# Patient Record
Sex: Female | Born: 1960 | Race: Asian | Hispanic: No | Marital: Married | Smoking: Never smoker
Health system: Southern US, Community
[De-identification: ages and names within clinical notes are randomized; demographics above are authoritative.]

## PROBLEM LIST (undated history)

## (undated) DIAGNOSIS — E079 Disorder of thyroid, unspecified: Secondary | ICD-10-CM

## (undated) DIAGNOSIS — I10 Essential (primary) hypertension: Secondary | ICD-10-CM

---

## 2004-06-20 ENCOUNTER — Ambulatory Visit (HOSPITAL_COMMUNITY): Admission: RE | Admit: 2004-06-20 | Discharge: 2004-06-20 | Payer: Self-pay | Admitting: Internal Medicine

## 2006-05-08 ENCOUNTER — Ambulatory Visit: Payer: Self-pay | Admitting: Nurse Practitioner

## 2006-05-18 ENCOUNTER — Ambulatory Visit (HOSPITAL_COMMUNITY): Admission: RE | Admit: 2006-05-18 | Discharge: 2006-05-18 | Payer: Self-pay | Admitting: Nurse Practitioner

## 2006-10-22 ENCOUNTER — Ambulatory Visit: Payer: Self-pay | Admitting: Internal Medicine

## 2006-10-24 ENCOUNTER — Ambulatory Visit: Payer: Self-pay | Admitting: *Deleted

## 2006-12-03 ENCOUNTER — Ambulatory Visit: Payer: Self-pay | Admitting: Internal Medicine

## 2006-12-05 ENCOUNTER — Ambulatory Visit (HOSPITAL_COMMUNITY): Admission: RE | Admit: 2006-12-05 | Discharge: 2006-12-05 | Payer: Self-pay | Admitting: Nurse Practitioner

## 2007-01-09 ENCOUNTER — Ambulatory Visit: Payer: Self-pay | Admitting: Nurse Practitioner

## 2007-04-03 ENCOUNTER — Ambulatory Visit: Payer: Self-pay | Admitting: Family Medicine

## 2007-06-05 ENCOUNTER — Encounter (INDEPENDENT_AMBULATORY_CARE_PROVIDER_SITE_OTHER): Payer: Self-pay | Admitting: *Deleted

## 2007-06-17 DIAGNOSIS — R51 Headache: Secondary | ICD-10-CM | POA: Insufficient documentation

## 2007-06-17 DIAGNOSIS — R519 Headache, unspecified: Secondary | ICD-10-CM | POA: Insufficient documentation

## 2007-06-26 ENCOUNTER — Encounter (INDEPENDENT_AMBULATORY_CARE_PROVIDER_SITE_OTHER): Payer: Self-pay | Admitting: Family Medicine

## 2007-06-26 ENCOUNTER — Ambulatory Visit: Payer: Self-pay | Admitting: Internal Medicine

## 2007-06-26 LAB — CONVERTED CEMR LAB
BUN: 9 mg/dL (ref 6–23)
CO2: 22 meq/L (ref 19–32)
Calcium: 8.8 mg/dL (ref 8.4–10.5)
Chloride: 109 meq/L (ref 96–112)
Creatinine, Ser: 0.66 mg/dL (ref 0.40–1.20)
Glucose, Bld: 75 mg/dL (ref 70–99)
Potassium: 4.9 meq/L (ref 3.5–5.3)
Sodium: 143 meq/L (ref 135–145)

## 2007-07-11 ENCOUNTER — Ambulatory Visit: Payer: Self-pay | Admitting: Internal Medicine

## 2007-07-11 ENCOUNTER — Encounter (INDEPENDENT_AMBULATORY_CARE_PROVIDER_SITE_OTHER): Payer: Self-pay | Admitting: Nurse Practitioner

## 2007-07-11 LAB — CONVERTED CEMR LAB
Cholesterol: 166 mg/dL (ref 0–200)
HDL: 54 mg/dL (ref >39)
LDL Cholesterol: 85 mg/dL (ref 0–99)
Total CHOL/HDL Ratio: 3.1
Triglycerides: 136 mg/dL (ref <150)
VLDL: 27 mg/dL (ref 0–40)

## 2007-11-08 ENCOUNTER — Ambulatory Visit: Payer: Self-pay | Admitting: Family Medicine

## 2007-12-25 ENCOUNTER — Encounter (INDEPENDENT_AMBULATORY_CARE_PROVIDER_SITE_OTHER): Payer: Self-pay | Admitting: Nurse Practitioner

## 2007-12-25 ENCOUNTER — Ambulatory Visit: Payer: Self-pay | Admitting: Internal Medicine

## 2007-12-25 LAB — CONVERTED CEMR LAB
ALT: 27 units/L (ref 0–35)
AST: 24 units/L (ref 0–37)
Albumin: 4.2 g/dL (ref 3.5–5.2)
Alkaline Phosphatase: 61 units/L (ref 39–117)
BUN: 13 mg/dL (ref 6–23)
Basophils Absolute: 0 10*3/uL (ref 0.0–0.1)
Basophils Relative: 0 % (ref 0–1)
CO2: 23 meq/L (ref 19–32)
Calcium: 8.9 mg/dL (ref 8.4–10.5)
Chloride: 103 meq/L (ref 96–112)
Cholesterol: 186 mg/dL (ref 0–200)
Creatinine, Ser: 0.76 mg/dL (ref 0.40–1.20)
Eosinophils Absolute: 0.1 10*3/uL (ref 0.0–0.7)
Eosinophils Relative: 1 % (ref 0–5)
Glucose, Bld: 78 mg/dL (ref 70–99)
HCT: 41.2 % (ref 36.0–46.0)
HDL: 48 mg/dL (ref >39)
Hemoglobin: 13.1 g/dL (ref 12.0–15.0)
LDL Cholesterol: 98 mg/dL (ref 0–99)
Lymphocytes Relative: 39 % (ref 12–46)
Lymphs Abs: 4.2 10*3/uL — ABNORMAL HIGH (ref 0.7–4.0)
MCHC: 31.8 g/dL (ref 30.0–36.0)
MCV: 92.2 fL (ref 78.0–100.0)
Monocytes Absolute: 0.7 10*3/uL (ref 0.1–1.0)
Monocytes Relative: 6 % (ref 3–12)
Neutro Abs: 5.8 10*3/uL (ref 1.7–7.7)
Neutrophils Relative %: 54 % (ref 43–77)
Platelets: 333 10*3/uL (ref 150–400)
Potassium: 4.4 meq/L (ref 3.5–5.3)
RBC: 4.47 M/uL (ref 3.87–5.11)
RDW: 13.3 % (ref 11.5–15.5)
Sodium: 138 meq/L (ref 135–145)
TSH: 3.366 microintl units/mL (ref 0.350–5.50)
Total Bilirubin: 0.5 mg/dL (ref 0.3–1.2)
Total CHOL/HDL Ratio: 3.9
Total Protein: 7.8 g/dL (ref 6.0–8.3)
Triglycerides: 199 mg/dL — ABNORMAL HIGH (ref <150)
VLDL: 40 mg/dL (ref 0–40)
WBC: 10.9 10*3/uL — ABNORMAL HIGH (ref 4.0–10.5)

## 2008-01-01 ENCOUNTER — Ambulatory Visit (HOSPITAL_COMMUNITY): Admission: RE | Admit: 2008-01-01 | Discharge: 2008-01-01 | Payer: Self-pay | Admitting: Family Medicine

## 2008-01-01 ENCOUNTER — Ambulatory Visit: Payer: Self-pay | Admitting: Cardiology

## 2008-01-01 ENCOUNTER — Encounter (INDEPENDENT_AMBULATORY_CARE_PROVIDER_SITE_OTHER): Payer: Self-pay | Admitting: Family Medicine

## 2008-01-02 ENCOUNTER — Ambulatory Visit (HOSPITAL_COMMUNITY): Admission: RE | Admit: 2008-01-02 | Discharge: 2008-01-02 | Payer: Self-pay | Admitting: Nurse Practitioner

## 2008-01-21 ENCOUNTER — Encounter (INDEPENDENT_AMBULATORY_CARE_PROVIDER_SITE_OTHER): Payer: Self-pay | Admitting: Nurse Practitioner

## 2008-01-21 ENCOUNTER — Ambulatory Visit: Payer: Self-pay | Admitting: Internal Medicine

## 2008-01-21 LAB — CONVERTED CEMR LAB
Cholesterol: 159 mg/dL (ref 0–200)
HDL: 55 mg/dL (ref >39)
LDL Cholesterol: 78 mg/dL (ref 0–99)
Total CHOL/HDL Ratio: 2.9
Triglycerides: 130 mg/dL (ref <150)
VLDL: 26 mg/dL (ref 0–40)

## 2008-01-27 ENCOUNTER — Ambulatory Visit: Payer: Self-pay | Admitting: Internal Medicine

## 2010-07-20 ENCOUNTER — Ambulatory Visit (HOSPITAL_COMMUNITY): Admission: RE | Admit: 2010-07-20 | Discharge: 2010-07-20 | Payer: Self-pay | Admitting: Obstetrics & Gynecology

## 2010-08-25 ENCOUNTER — Observation Stay (HOSPITAL_COMMUNITY)
Admission: EM | Admit: 2010-08-25 | Discharge: 2010-08-26 | Payer: Self-pay | Source: Home / Self Care | Attending: Emergency Medicine | Admitting: Emergency Medicine

## 2010-08-25 ENCOUNTER — Emergency Department (HOSPITAL_COMMUNITY)
Admission: EM | Admit: 2010-08-25 | Discharge: 2010-08-25 | Disposition: A | Discharge status: Other Acute Inpatient Hospital | Payer: Self-pay | Source: Home / Self Care | Admitting: Family Medicine

## 2010-08-26 ENCOUNTER — Encounter (INDEPENDENT_AMBULATORY_CARE_PROVIDER_SITE_OTHER): Payer: Self-pay | Admitting: Emergency Medicine

## 2010-10-09 ENCOUNTER — Encounter: Payer: Self-pay | Admitting: Interventional Cardiology

## 2010-10-27 ENCOUNTER — Ambulatory Visit (HOSPITAL_COMMUNITY)
Admission: RE | Admit: 2010-10-27 | Discharge: 2010-10-27 | Disposition: A | Discharge status: Home / Self Care | Payer: Self-pay | Source: Ambulatory Visit | Attending: Family Medicine | Admitting: Family Medicine

## 2010-10-27 DIAGNOSIS — R0602 Shortness of breath: Secondary | ICD-10-CM | POA: Insufficient documentation

## 2010-11-29 LAB — POCT I-STAT, CHEM 8
BUN: 10 mg/dL (ref 6–23)
Calcium, Ion: 1.09 mmol/L — ABNORMAL LOW (ref 1.12–1.32)
Chloride: 107 mEq/L (ref 96–112)
Creatinine, Ser: 0.9 mg/dL (ref 0.4–1.2)
Glucose, Bld: 97 mg/dL (ref 70–99)
HCT: 42 % (ref 36.0–46.0)
Hemoglobin: 14.3 g/dL (ref 12.0–15.0)
Potassium: 3.7 mEq/L (ref 3.5–5.1)
Sodium: 139 mEq/L (ref 135–145)
TCO2: 25 mmol/L (ref 0–100)

## 2010-11-29 LAB — POCT CARDIAC MARKERS
CKMB, poc: 1.5 ng/mL (ref 1.0–8.0)
CKMB, poc: <1 ng/mL — ABNORMAL LOW (ref 1.0–8.0)
Myoglobin, poc: 46 ng/mL (ref 12–200)
Myoglobin, poc: 63.5 ng/mL (ref 12–200)
Troponin i, poc: <0.05 ng/mL (ref 0.00–0.09)
Troponin i, poc: <0.05 ng/mL (ref 0.00–0.09)

## 2010-11-29 LAB — CK TOTAL AND CKMB (NOT AT ARMC)
CK, MB: 1.1 ng/mL (ref 0.3–4.0)
Relative Index: INVALID (ref 0.0–2.5)
Total CK: 85 U/L (ref 7–177)

## 2010-11-29 LAB — TROPONIN I: Troponin I: 0.03 ng/mL (ref 0.00–0.06)

## 2011-05-16 ENCOUNTER — Other Ambulatory Visit (HOSPITAL_COMMUNITY): Payer: Self-pay | Admitting: Family Medicine

## 2011-05-16 ENCOUNTER — Ambulatory Visit (HOSPITAL_COMMUNITY)
Admission: RE | Admit: 2011-05-16 | Discharge: 2011-05-16 | Disposition: A | Discharge status: Home / Self Care | Payer: Self-pay | Source: Ambulatory Visit | Attending: Family Medicine | Admitting: Family Medicine

## 2011-05-16 DIAGNOSIS — R52 Pain, unspecified: Secondary | ICD-10-CM

## 2011-05-16 DIAGNOSIS — M8448XA Pathological fracture, other site, initial encounter for fracture: Secondary | ICD-10-CM | POA: Insufficient documentation

## 2011-05-16 DIAGNOSIS — R609 Edema, unspecified: Secondary | ICD-10-CM | POA: Insufficient documentation

## 2011-05-16 DIAGNOSIS — M79609 Pain in unspecified limb: Secondary | ICD-10-CM | POA: Insufficient documentation

## 2011-06-19 ENCOUNTER — Other Ambulatory Visit: Payer: Self-pay | Admitting: Family Medicine

## 2011-08-07 ENCOUNTER — Other Ambulatory Visit (HOSPITAL_COMMUNITY): Payer: Self-pay | Admitting: Family Medicine

## 2011-08-07 DIAGNOSIS — Z1231 Encounter for screening mammogram for malignant neoplasm of breast: Secondary | ICD-10-CM

## 2011-09-08 ENCOUNTER — Ambulatory Visit (HOSPITAL_COMMUNITY): Payer: Self-pay

## 2011-12-05 ENCOUNTER — Ambulatory Visit: Payer: Self-pay | Attending: Orthopedic Surgery | Admitting: Occupational Therapy

## 2011-12-05 DIAGNOSIS — M25549 Pain in joints of unspecified hand: Secondary | ICD-10-CM | POA: Insufficient documentation

## 2011-12-05 DIAGNOSIS — M25649 Stiffness of unspecified hand, not elsewhere classified: Secondary | ICD-10-CM | POA: Insufficient documentation

## 2011-12-05 DIAGNOSIS — IMO0001 Reserved for inherently not codable concepts without codable children: Secondary | ICD-10-CM | POA: Insufficient documentation

## 2011-12-13 ENCOUNTER — Ambulatory Visit: Payer: Self-pay | Admitting: Occupational Therapy

## 2011-12-19 ENCOUNTER — Ambulatory Visit: Payer: Self-pay | Attending: Orthopedic Surgery | Admitting: Occupational Therapy

## 2011-12-19 DIAGNOSIS — M25549 Pain in joints of unspecified hand: Secondary | ICD-10-CM | POA: Insufficient documentation

## 2011-12-19 DIAGNOSIS — M25649 Stiffness of unspecified hand, not elsewhere classified: Secondary | ICD-10-CM | POA: Insufficient documentation

## 2011-12-19 DIAGNOSIS — IMO0001 Reserved for inherently not codable concepts without codable children: Secondary | ICD-10-CM | POA: Insufficient documentation

## 2011-12-20 ENCOUNTER — Ambulatory Visit: Payer: Self-pay | Admitting: Occupational Therapy

## 2011-12-25 ENCOUNTER — Ambulatory Visit: Payer: Self-pay | Admitting: Occupational Therapy

## 2011-12-27 ENCOUNTER — Encounter: Payer: Self-pay | Admitting: Occupational Therapy

## 2011-12-28 ENCOUNTER — Ambulatory Visit: Payer: Self-pay | Admitting: Occupational Therapy

## 2012-01-01 ENCOUNTER — Ambulatory Visit: Payer: Self-pay | Admitting: Occupational Therapy

## 2012-01-03 ENCOUNTER — Ambulatory Visit: Payer: Self-pay | Admitting: Occupational Therapy

## 2012-01-31 ENCOUNTER — Ambulatory Visit (HOSPITAL_COMMUNITY)
Admission: RE | Admit: 2012-01-31 | Discharge: 2012-01-31 | Disposition: A | Discharge status: Home / Self Care | Payer: Self-pay | Source: Ambulatory Visit | Attending: Family Medicine | Admitting: Family Medicine

## 2012-01-31 ENCOUNTER — Other Ambulatory Visit (HOSPITAL_COMMUNITY): Payer: Self-pay | Admitting: Family Medicine

## 2012-01-31 DIAGNOSIS — M7989 Other specified soft tissue disorders: Secondary | ICD-10-CM | POA: Insufficient documentation

## 2012-01-31 DIAGNOSIS — M79609 Pain in unspecified limb: Secondary | ICD-10-CM | POA: Insufficient documentation

## 2012-01-31 DIAGNOSIS — M899 Disorder of bone, unspecified: Secondary | ICD-10-CM | POA: Insufficient documentation

## 2012-01-31 DIAGNOSIS — R52 Pain, unspecified: Secondary | ICD-10-CM

## 2012-01-31 DIAGNOSIS — M949 Disorder of cartilage, unspecified: Secondary | ICD-10-CM | POA: Insufficient documentation

## 2012-02-23 ENCOUNTER — Ambulatory Visit (HOSPITAL_COMMUNITY)
Admission: RE | Admit: 2012-02-23 | Discharge: 2012-02-23 | Disposition: A | Discharge status: Home / Self Care | Payer: Self-pay | Source: Ambulatory Visit | Attending: Family Medicine | Admitting: Family Medicine

## 2012-02-23 DIAGNOSIS — Z1231 Encounter for screening mammogram for malignant neoplasm of breast: Secondary | ICD-10-CM | POA: Insufficient documentation

## 2012-10-17 ENCOUNTER — Emergency Department (HOSPITAL_COMMUNITY)
Admission: EM | Admit: 2012-10-17 | Discharge: 2012-10-17 | Disposition: A | Discharge status: Home / Self Care | Payer: Self-pay | Source: Home / Self Care | Attending: Family Medicine | Admitting: Family Medicine

## 2012-10-17 DIAGNOSIS — Z23 Encounter for immunization: Secondary | ICD-10-CM

## 2012-10-17 DIAGNOSIS — I10 Essential (primary) hypertension: Secondary | ICD-10-CM

## 2012-10-17 DIAGNOSIS — R002 Palpitations: Secondary | ICD-10-CM

## 2012-10-17 LAB — CBC
HCT: 42.9 % (ref 36.0–46.0)
Hemoglobin: 15.3 g/dL — ABNORMAL HIGH (ref 12.0–15.0)
MCH: 30.5 pg (ref 26.0–34.0)
MCHC: 35.7 g/dL (ref 30.0–36.0)
MCV: 85.6 fL (ref 78.0–100.0)
Platelets: 261 10*3/uL (ref 150–400)
RBC: 5.01 MIL/uL (ref 3.87–5.11)
RDW: 13 % (ref 11.5–15.5)
WBC: 10.7 10*3/uL — ABNORMAL HIGH (ref 4.0–10.5)

## 2012-10-17 LAB — COMPREHENSIVE METABOLIC PANEL
ALT: 23 U/L (ref 0–35)
Albumin: 4.4 g/dL (ref 3.5–5.2)
Calcium: 10.1 mg/dL (ref 8.4–10.5)
GFR calc Af Amer: >90 mL/min (ref >90)
Glucose, Bld: 88 mg/dL (ref 70–99)
Sodium: 137 mEq/L (ref 135–145)
Total Protein: 9.1 g/dL — ABNORMAL HIGH (ref 6.0–8.3)

## 2012-10-17 LAB — LIPID PANEL
HDL: 44 mg/dL (ref >39)
LDL Cholesterol: 114 mg/dL — ABNORMAL HIGH (ref 0–99)
Total CHOL/HDL Ratio: 5.1 RATIO
Triglycerides: 330 mg/dL — ABNORMAL HIGH (ref <150)

## 2012-10-17 MED ORDER — HYDROCHLOROTHIAZIDE 25 MG PO TABS: 25.0000 mg | ORAL_TABLET | Freq: Every day | ORAL | Status: DC

## 2012-10-17 MED ORDER — INFLUENZA VIRUS VACC SPLIT PF IM SUSP
0.5000 mL | Freq: Once | INTRAMUSCULAR | Status: AC
  Administered 2012-10-17: 0.5 mL via INTRAMUSCULAR

## 2012-10-17 NOTE — ED Provider Notes (Signed)
History    CSN: [id number]  Arrival date & time 10/17/12  1718   First MD Initiated Contact with Patient 10/17/12 1742    CC:  REfill medications  HPI Pt presenting to have refills of her regular medications for blood pressure.  Pt says that she has had some palpatations at times but not frequently.  Pt says she had an episode of hypoglycemia (she had not been eating).  She would like to have her blood sugar checked.  Pt does report cramping in feet and hands.    No past medical history on file.  No past surgical history on file.  No family history on file.  History  Substance Use Topics  . Smoking status: Not on file  . Smokeless tobacco: Not on file  . Alcohol Use: Not on file    OB History    No data available     Review of Systems  HENT: Positive for congestion.   Cardiovascular: Positive for palpitations.  Neurological: Negative for weakness.  All other systems reviewed and are negative.    Allergies  Review of patient's allergies indicates not on file.  Home Medications  Hydrochlorothiazide 25 mg po daily  Vitals:  Temp 98.6, Pulse 70, R-16, BP 125/82, pulse ox 100% on RA  Physical Exam  Nursing note and vitals reviewed. Constitutional: She is oriented to person, place, and time. She appears well-developed and well-nourished. No distress.  HENT:  Head: Normocephalic and atraumatic.  Right Ear: External ear normal.  Left Ear: External ear normal.  Nose: Nose normal.  Eyes: Conjunctivae normal and EOM are normal. Pupils are equal, round, and reactive to light. No scleral icterus.  Neck: Normal range of motion. Neck supple. No JVD present. No thyromegaly present.  Cardiovascular: Normal rate, regular rhythm and normal heart sounds.   Pulmonary/Chest: Effort normal and breath sounds normal. No respiratory distress. She has no wheezes. She has no rales. She exhibits no tenderness.  Abdominal: Soft. Bowel sounds are normal. She exhibits no distension and no  mass. There is no tenderness. There is no rebound and no guarding.  Musculoskeletal: Normal range of motion. She exhibits no edema and no tenderness.  Lymphadenopathy:    She has no cervical adenopathy.  Neurological: She is alert and oriented to person, place, and time. She has normal reflexes. No cranial nerve deficit. She exhibits normal muscle tone. Coordination normal.  Skin: Skin is warm and dry. No rash noted. No erythema. No pallor.  Psychiatric: She has a normal mood and affect. Her behavior is normal. Judgment and thought content normal.    ED Course  Procedures (including critical care time)  Labs Reviewed - No data to display No results found.   MDM  IMPRESSION  Hypertension  palpitations  RECOMMENDATIONS / PLAN FLU VACCINE PROVIDED  REFILLED MEDS CHECK LABS CHECK TSH  FOLLOW UP 4 months   The patient was given clear instructions to go to ER or return to medical center if symptoms don't improve, worsen or new problems develop.  The patient verbalized understanding.  The patient was told to call to get lab results if they haven't heard anything in the next week.            Cleora Fleet, MD 10/17/12 1759

## 2012-10-18 LAB — TSH: TSH: 4.211 u[IU]/mL (ref 0.350–4.500)

## 2012-10-18 NOTE — Progress Notes (Signed)
Quick Note:  Please notify patient that her potassium came back low. This goes along with her taking 25 mg of hydrochlorothiazide. I recommend that she take potassium chloride supplements daily with the blood pressure medicine so that her potassium doesn't become depleted. Please call in KCl 10 meq - take 1 po daily, #30, RFx3. Please tell the patient that her other labs came back okay. I recommend that she have her labs rechecked again in 3 months.   Rodney Langton, MD, CDE, FAAFP [facility] [place], [place]   ______

## 2012-11-26 NOTE — Progress Notes (Signed)
Reviewed the patient's lab work. Her potassium was low at 3.3. Her cholesterol was 224, triglycerides 330, VL DL 66, and LDL 409. HDL was 44. Hemoglobin A1c was 5.6.  I called the patient to discuss these findings with her and recommended a low-fat, no concentrated sweet diet. I also called in prescriptions for Pravachol 40 mg daily with 2 refills and obtain your 20 mEq Daily with 2 refills to her pharmacy. Additionally, I instructed her to buy herself a fish oil supplement and take 1000 mg twice a day. I answered all of her questions and recommended that she followup as scheduled.  Timonthy Hovater 11/26/2012 10:12 AM

## 2013-04-21 ENCOUNTER — Other Ambulatory Visit: Payer: Self-pay | Admitting: Family Medicine

## 2013-04-21 DIAGNOSIS — Z1231 Encounter for screening mammogram for malignant neoplasm of breast: Secondary | ICD-10-CM

## 2013-04-22 ENCOUNTER — Ambulatory Visit (HOSPITAL_COMMUNITY): Payer: BC Managed Care – PPO

## 2013-04-23 ENCOUNTER — Ambulatory Visit (HOSPITAL_COMMUNITY)
Admission: RE | Admit: 2013-04-23 | Discharge: 2013-04-23 | Disposition: A | Discharge status: Home / Self Care | Payer: BC Managed Care – PPO | Source: Ambulatory Visit | Attending: Family Medicine | Admitting: Family Medicine

## 2013-04-23 DIAGNOSIS — Z1231 Encounter for screening mammogram for malignant neoplasm of breast: Secondary | ICD-10-CM

## 2013-05-06 ENCOUNTER — Encounter (HOSPITAL_COMMUNITY): Payer: Self-pay | Admitting: *Deleted

## 2013-05-06 ENCOUNTER — Emergency Department (HOSPITAL_COMMUNITY)
Admission: EM | Admit: 2013-05-06 | Discharge: 2013-05-06 | Disposition: A | Discharge status: Home / Self Care | Payer: BC Managed Care – PPO | Source: Home / Self Care | Attending: Emergency Medicine | Admitting: Emergency Medicine

## 2013-05-06 DIAGNOSIS — H9209 Otalgia, unspecified ear: Secondary | ICD-10-CM

## 2013-05-06 DIAGNOSIS — H9203 Otalgia, bilateral: Secondary | ICD-10-CM

## 2013-05-06 MED ORDER — HYDROCORTISONE-ACETIC ACID 1-2 % OT SOLN: 3.0000 [drp] | Freq: Three times a day (TID) | OTIC | Status: DC

## 2013-05-06 MED ORDER — CEPHALEXIN 500 MG PO CAPS: 500.0000 mg | ORAL_CAPSULE | Freq: Three times a day (TID) | ORAL | Status: DC

## 2013-05-06 NOTE — ED Provider Notes (Signed)
Chief Complaint:   Chief Complaint  Patient presents with  . Otalgia    History of Present Illness:   Natalie Maynard is a 52 year old female who has had a 2 to three-day history of bilateral ear pain. It hurts if she presses on her external ear. She denies any swelling or redness externally. She's had no sensation of ear congestion, difficulty hearing, ringing, or vertigo. She denies any fever, chills, headache, nasal congestion, rhinorrhea, sneezing, sore throat, or adenopathy. She's had no pain with biting or chewing and no dental problems. She states the pain was preceded by some itching in both ears.  Review of Systems:  Other than noted above, the patient denies any of the following symptoms: Systemic:  No fevers, chills, sweats, weight loss or gain, fatigue, or tiredness. Eye:  No redness, pain, discharge, itching, blurred vision, or diplopia. ENT:  No headache, nasal congestion, sneezing, itching, epistaxis, ear pain, congestion, decreased hearing, ringing in ears, vertigo, or tinnitus.  No oral lesions, sore throat, pain on swallowing, or hoarseness. Neck:  No mass, tenderness or adenopathy. Lungs:  No coughing, wheezing, or shortness of breath. Skin:  No rash or itching.  PMFSH:  Past medical history, family history, social history, meds, and allergies were reviewed. She has hypertension and elevated cholesterol. She takes phentermine for weight loss.  Physical Exam:   Vital signs:  BP 127/89  Pulse 74  Temp(Src) 99.3 F (37.4 C) (Oral)  Resp 16  SpO2 96%  LMP 05/05/2013 General:  Alert and oriented.  In no distress.  Skin warm and dry. Eye:  PERRL, full EOMs, lids and conjunctiva normal.   ENT:  TMs and canals clear.  Nasal mucosa not congested and without drainage.  Mucous membranes moist, no oral lesions, normal dentition, pharynx clear.  No cranial or facial pain to palplation. Neck:  Supple, full ROM.  No adenopathy, tenderness or mass.  Thyroid normal. Lungs:  Breath  sounds clear and equal bilaterally.  No wheezes, rales or rhonchi. Heart:  Rhythm regular, without extrasystoles.  No gallops or murmers. Skin:  Clear, warm and dry.  Assessment:  The encounter diagnosis was Otalgia of both ears.  Ear exam was completely normal. Differential diagnosis includes a small furuncle external ear canal, otitis externa, or TMJ arthritis. For right now we'll treat with VoSol otic solution and cephalexin. Followup with ENT if no better in 2 weeks.  Plan:   1.  The following meds were prescribed:   Discharge Medication List as of 05/06/2013  4:02 PM    START taking these medications   Details  acetic acid-hydrocortisone (VOSOL-HC) otic solution Place 3 drops into both ears 3 (three) times daily., Starting 05/06/2013, Until Discontinued, Normal    cephALEXin (KEFLEX) 500 MG capsule Take 1 capsule (500 mg total) by mouth 3 (three) times daily., Starting 05/06/2013, Until Discontinued, Normal       2.  The patient was instructed in symptomatic care and handouts were given. 3.  The patient was told to return if becoming worse in any way, if no better in 3 or 4 days, and given some red flag symptoms such as worsening pain that would indicate earlier return. 4.  Follow up with Dr. Brynda Peon if no better in 2 weeks.    Reuben Likes, MD 05/06/13 2250

## 2013-05-06 NOTE — ED Notes (Signed)
Pt  Reports  Symptoms  Of  Bilateral  Earaches  X  1  Week        She  Is  Sitting  Upright  On  Exam table in no  Distress     Speaking in  Complete  sentances

## 2013-07-17 ENCOUNTER — Ambulatory Visit: Payer: Self-pay | Admitting: Internal Medicine

## 2014-01-10 ENCOUNTER — Emergency Department (HOSPITAL_COMMUNITY)
Admission: EM | Admit: 2014-01-10 | Discharge: 2014-01-10 | Disposition: A | Discharge status: Home / Self Care | Payer: No Typology Code available for payment source | Attending: Emergency Medicine | Admitting: Emergency Medicine

## 2014-01-10 ENCOUNTER — Encounter (HOSPITAL_COMMUNITY): Payer: Self-pay | Admitting: Emergency Medicine

## 2014-01-10 DIAGNOSIS — S20219A Contusion of unspecified front wall of thorax, initial encounter: Secondary | ICD-10-CM | POA: Insufficient documentation

## 2014-01-10 DIAGNOSIS — Y9389 Activity, other specified: Secondary | ICD-10-CM | POA: Insufficient documentation

## 2014-01-10 DIAGNOSIS — Y92410 Unspecified street and highway as the place of occurrence of the external cause: Secondary | ICD-10-CM | POA: Insufficient documentation

## 2014-01-10 DIAGNOSIS — S6980XA Other specified injuries of unspecified wrist, hand and finger(s), initial encounter: Secondary | ICD-10-CM | POA: Insufficient documentation

## 2014-01-10 DIAGNOSIS — Z862 Personal history of diseases of the blood and blood-forming organs and certain disorders involving the immune mechanism: Secondary | ICD-10-CM | POA: Insufficient documentation

## 2014-01-10 DIAGNOSIS — I10 Essential (primary) hypertension: Secondary | ICD-10-CM | POA: Insufficient documentation

## 2014-01-10 DIAGNOSIS — Z79899 Other long term (current) drug therapy: Secondary | ICD-10-CM | POA: Insufficient documentation

## 2014-01-10 DIAGNOSIS — Z8639 Personal history of other endocrine, nutritional and metabolic disease: Secondary | ICD-10-CM | POA: Insufficient documentation

## 2014-01-10 DIAGNOSIS — S2000XA Contusion of breast, unspecified breast, initial encounter: Secondary | ICD-10-CM

## 2014-01-10 DIAGNOSIS — S6990XA Unspecified injury of unspecified wrist, hand and finger(s), initial encounter: Secondary | ICD-10-CM | POA: Insufficient documentation

## 2014-01-10 DIAGNOSIS — Z792 Long term (current) use of antibiotics: Secondary | ICD-10-CM | POA: Insufficient documentation

## 2014-01-10 HISTORY — DX: Essential (primary) hypertension: I10

## 2014-01-10 HISTORY — DX: Disorder of thyroid, unspecified: E07.9

## 2014-01-10 MED ORDER — IBUPROFEN 200 MG PO TABS
600.0000 mg | ORAL_TABLET | Freq: Once | ORAL | Status: AC
  Administered 2014-01-10: 600 mg via ORAL
  Filled 2014-01-10: qty 3

## 2014-01-10 MED ORDER — NAPROXEN 375 MG PO TABS: 375.0000 mg | ORAL_TABLET | Freq: Two times a day (BID) | ORAL | Status: DC

## 2014-01-10 NOTE — ED Provider Notes (Signed)
Medical screening examination/treatment/procedure(s) were performed by non-physician practitioner and as supervising physician I was immediately available for consultation/collaboration.   EKG Interpretation None      Devoria AlbeIva Tiaria Biby, MD, Armando GangFACEP   Ward GivensIva L Aanyah Loa, MD 01/10/14 2240

## 2014-01-10 NOTE — Discharge Instructions (Signed)
Contusion °A contusion is a deep bruise. Contusions happen when an injury causes bleeding under the skin. Signs of bruising include pain, puffiness (swelling), and discolored skin. The contusion may turn blue, purple, or yellow. °HOME CARE  °· Put ice on the injured area. °· Put ice in a plastic bag. °· Place a towel between your skin and the bag. °· Leave the ice on for 15-20 minutes, 03-04 times a day. °· Only take medicine as told by your doctor. °· Rest the injured area. °· If possible, raise (elevate) the injured area to lessen puffiness. °GET HELP RIGHT AWAY IF:  °· You have more bruising or puffiness. °· You have pain that is getting worse. °· Your puffiness or pain is not helped by medicine. °MAKE SURE YOU:  °· Understand these instructions. °· Will watch your condition. °· Will get help right away if you are not doing well or get worse. °Document Released: 02/21/2008 Document Revised: 11/27/2011 Document Reviewed: 07/10/2011 °[employer]® Patient Information ©2014 [employer]. ° °Cryotherapy °Cryotherapy means treatment with cold. Ice or gel packs can be used to reduce both pain and swelling. Ice is the most helpful within the first 24 to 48 hours after an injury or flareup from overusing a muscle or joint. Sprains, strains, spasms, burning pain, shooting pain, and aches can all be eased with ice. Ice can also be used when recovering from surgery. Ice is effective, has very few side effects, and is safe for most people to use. °PRECAUTIONS  °Ice is not a safe treatment option for people with: °· Raynaud's phenomenon. This is a condition affecting small blood vessels in the extremities. Exposure to cold may cause your problems to return. °· Cold hypersensitivity. There are many forms of cold hypersensitivity, including: °· Cold urticaria. Red, itchy hives appear on the skin when the tissues begin to warm after being iced. °· Cold erythema. This is a red, itchy rash caused by exposure to cold. °· Cold  hemoglobinuria. Red blood cells break down when the tissues begin to warm after being iced. The hemoglobin that carry oxygen are passed into the urine because they cannot combine with blood proteins fast enough. °· Numbness or altered sensitivity in the area being iced. °If you have any of the following conditions, do not use ice until you have discussed cryotherapy with your caregiver: °· Heart conditions, such as arrhythmia, angina, or chronic heart disease. °· High blood pressure. °· Healing wounds or open skin in the area being iced. °· Current infections. °· Rheumatoid arthritis. °· Poor circulation. °· Diabetes. °Ice slows the blood flow in the region it is applied. This is beneficial when trying to stop inflamed tissues from spreading irritating chemicals to surrounding tissues. However, if you expose your skin to cold temperatures for too long or without the proper protection, you can damage your skin or nerves. Watch for signs of skin damage due to cold. °HOME CARE INSTRUCTIONS °Follow these tips to use ice and cold packs safely. °· Place a dry or damp towel between the ice and skin. A damp towel will cool the skin more quickly, so you may need to shorten the time that the ice is used. °· For a more rapid response, add gentle compression to the ice. °· Ice for no more than 10 to 20 minutes at a time. The bonier the area you are icing, the less time it will take to get the benefits of ice. °· Check your skin after 5 minutes to make sure   there are no signs of a poor response to cold or skin damage.  Rest 20 minutes or more in between uses.  Once your skin is numb, you can end your treatment. You can test numbness by very lightly touching your skin. The touch should be so light that you do not see the skin dimple from the pressure of your fingertip. When using ice, most people will feel these normal sensations in this order: cold, burning, aching, and numbness.  Do not use ice on someone who cannot  communicate their responses to pain, such as small children or people with dementia. HOW TO MAKE AN ICE PACK Ice packs are the most common way to use ice therapy. Other methods include ice massage, ice baths, and cryo-sprays. Muscle creams that cause a cold, tingly feeling do not offer the same benefits that ice offers and should not be used as a substitute unless recommended by your caregiver. To make an ice pack, do one of the following:  Place crushed ice or a bag of frozen vegetables in a sealable plastic bag. Squeeze out the excess air. Place this bag inside another plastic bag. Slide the bag into a pillowcase or place a damp towel between your skin and the bag.  Mix 3 parts water with 1 part rubbing alcohol. Freeze the mixture in a sealable plastic bag. When you remove the mixture from the freezer, it will be slushy. Squeeze out the excess air. Place this bag inside another plastic bag. Slide the bag into a pillowcase or place a damp towel between your skin and the bag. SEEK MEDICAL CARE IF:  You develop white spots on your skin. This may give the skin a blotchy (mottled) appearance.  Your skin turns blue or pale.  Your skin becomes waxy or hard.  Your swelling gets worse. MAKE SURE YOU:   Understand these instructions.  Will watch your condition.  Will get help right away if you are not doing well or get worse. Document Released: 05/01/2011 Document Revised: 11/27/2011 Document Reviewed: 05/01/2011 [facility] Patient Information 2014 [place], [place]. Take anti-inflammatory medication on a regular basis for the next several days.  He can use the ice pack to the contused area for 20 minutes every 2 hours as needed.  For discomfort
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
## 2014-04-01 ENCOUNTER — Other Ambulatory Visit (HOSPITAL_COMMUNITY): Payer: Self-pay | Admitting: Nurse Practitioner

## 2014-04-01 DIAGNOSIS — Z1231 Encounter for screening mammogram for malignant neoplasm of breast: Secondary | ICD-10-CM

## 2014-04-24 ENCOUNTER — Ambulatory Visit (HOSPITAL_COMMUNITY)
Admission: RE | Admit: 2014-04-24 | Discharge: 2014-04-24 | Disposition: A | Discharge status: Home / Self Care | Payer: No Typology Code available for payment source | Source: Ambulatory Visit | Attending: Nurse Practitioner | Admitting: Nurse Practitioner

## 2014-04-24 DIAGNOSIS — Z1231 Encounter for screening mammogram for malignant neoplasm of breast: Secondary | ICD-10-CM | POA: Insufficient documentation

## 2014-05-15 ENCOUNTER — Encounter (HOSPITAL_COMMUNITY): Payer: Self-pay | Admitting: Emergency Medicine

## 2014-05-15 ENCOUNTER — Observation Stay (HOSPITAL_COMMUNITY)
Admission: EM | Admit: 2014-05-15 | Discharge: 2014-05-15 | Disposition: A | Discharge status: Home / Self Care | Payer: No Typology Code available for payment source | Attending: Internal Medicine | Admitting: Internal Medicine

## 2014-05-15 ENCOUNTER — Emergency Department (HOSPITAL_COMMUNITY): Payer: No Typology Code available for payment source

## 2014-05-15 DIAGNOSIS — I499 Cardiac arrhythmia, unspecified: Secondary | ICD-10-CM

## 2014-05-15 DIAGNOSIS — Z79899 Other long term (current) drug therapy: Secondary | ICD-10-CM | POA: Insufficient documentation

## 2014-05-15 DIAGNOSIS — R002 Palpitations: Secondary | ICD-10-CM | POA: Insufficient documentation

## 2014-05-15 DIAGNOSIS — R0609 Other forms of dyspnea: Secondary | ICD-10-CM

## 2014-05-15 DIAGNOSIS — R0600 Dyspnea, unspecified: Secondary | ICD-10-CM

## 2014-05-15 DIAGNOSIS — I10 Essential (primary) hypertension: Secondary | ICD-10-CM | POA: Insufficient documentation

## 2014-05-15 DIAGNOSIS — R51 Headache: Secondary | ICD-10-CM

## 2014-05-15 DIAGNOSIS — E079 Disorder of thyroid, unspecified: Secondary | ICD-10-CM | POA: Diagnosis not present

## 2014-05-15 DIAGNOSIS — I498 Other specified cardiac arrhythmias: Principal | ICD-10-CM | POA: Diagnosis present

## 2014-05-15 DIAGNOSIS — I369 Nonrheumatic tricuspid valve disorder, unspecified: Secondary | ICD-10-CM

## 2014-05-15 DIAGNOSIS — R0989 Other specified symptoms and signs involving the circulatory and respiratory systems: Secondary | ICD-10-CM

## 2014-05-15 LAB — BASIC METABOLIC PANEL
ANION GAP: 16 — AB (ref 5–15)
BUN: 10 mg/dL (ref 6–23)
CHLORIDE: 103 meq/L (ref 96–112)
CO2: 21 meq/L (ref 19–32)
Calcium: 9 mg/dL (ref 8.4–10.5)
Creatinine, Ser: 0.7 mg/dL (ref 0.50–1.10)
GFR calc non Af Amer: >90 mL/min (ref >90)
Glucose, Bld: 106 mg/dL — ABNORMAL HIGH (ref 70–99)
POTASSIUM: 3.7 meq/L (ref 3.7–5.3)
SODIUM: 140 meq/L (ref 137–147)

## 2014-05-15 LAB — CBC WITH DIFFERENTIAL/PLATELET
BASOS ABS: 0 10*3/uL (ref 0.0–0.1)
BASOS PCT: 0 % (ref 0–1)
Eosinophils Absolute: 0.1 10*3/uL (ref 0.0–0.7)
Eosinophils Relative: 1 % (ref 0–5)
HEMATOCRIT: 38.2 % (ref 36.0–46.0)
Hemoglobin: 13.3 g/dL (ref 12.0–15.0)
LYMPHS PCT: 45 % (ref 12–46)
Lymphs Abs: 4.3 10*3/uL — ABNORMAL HIGH (ref 0.7–4.0)
MCH: 29.7 pg (ref 26.0–34.0)
MCHC: 34.8 g/dL (ref 30.0–36.0)
MCV: 85.3 fL (ref 78.0–100.0)
MONO ABS: 0.6 10*3/uL (ref 0.1–1.0)
Monocytes Relative: 6 % (ref 3–12)
NEUTROS ABS: 4.6 10*3/uL (ref 1.7–7.7)
NEUTROS PCT: 48 % (ref 43–77)
PLATELETS: 319 10*3/uL (ref 150–400)
RBC: 4.48 MIL/uL (ref 3.87–5.11)
RDW: 12.4 % (ref 11.5–15.5)
WBC: 9.6 10*3/uL (ref 4.0–10.5)

## 2014-05-15 LAB — TROPONIN I
Troponin I: <0.3 ng/mL (ref <0.30)
Troponin I: <0.3 ng/mL (ref <0.30)

## 2014-05-15 LAB — D-DIMER, QUANTITATIVE: D-Dimer, Quant: 0.31 ug/mL-FEU (ref 0.00–0.48)

## 2014-05-15 LAB — HCG, SERUM, QUALITATIVE: Preg, Serum: NEGATIVE

## 2014-05-15 LAB — T4, FREE: Free T4: 0.88 ng/dL (ref 0.80–1.80)

## 2014-05-15 LAB — TSH: TSH: 5.72 u[IU]/mL — ABNORMAL HIGH (ref 0.350–4.500)

## 2014-05-15 MED ORDER — ONDANSETRON HCL 4 MG/2ML IJ SOLN: 4.0000 mg | Freq: Four times a day (QID) | INTRAMUSCULAR | Status: DC | PRN

## 2014-05-15 MED ORDER — ENOXAPARIN SODIUM 40 MG/0.4ML ~~LOC~~ SOLN
40.0000 mg | SUBCUTANEOUS | Status: DC
  Filled 2014-05-15: qty 0.4

## 2014-05-15 MED ORDER — ASPIRIN EC 81 MG PO TBEC
81.0000 mg | DELAYED_RELEASE_TABLET | Freq: Every day | ORAL | Status: DC
  Administered 2014-05-15: 81 mg via ORAL
  Filled 2014-05-15: qty 1

## 2014-05-15 MED ORDER — MAGNESIUM SULFATE 40 MG/ML IJ SOLN
2.0000 g | Freq: Once | INTRAMUSCULAR | Status: AC
  Administered 2014-05-15: 2 g via INTRAVENOUS
  Filled 2014-05-15: qty 50

## 2014-05-15 MED ORDER — ALPRAZOLAM 0.25 MG PO TABS: 0.2500 mg | ORAL_TABLET | Freq: Two times a day (BID) | ORAL | Status: DC | PRN

## 2014-05-15 MED ORDER — ASPIRIN 325 MG PO TABS
325.0000 mg | ORAL_TABLET | Freq: Once | ORAL | Status: AC
  Administered 2014-05-15: 325 mg via ORAL
  Filled 2014-05-15: qty 1

## 2014-05-15 MED ORDER — ACETAMINOPHEN 325 MG PO TABS
650.0000 mg | ORAL_TABLET | ORAL | Status: DC | PRN
Start: 2014-05-15 — End: 2014-05-15

## 2014-05-15 MED ORDER — FAMOTIDINE 20 MG PO TABS
20.0000 mg | ORAL_TABLET | Freq: Two times a day (BID) | ORAL | Status: DC
  Administered 2014-05-15: 20 mg via ORAL
  Filled 2014-05-15 (×2): qty 1

## 2014-05-15 MED ORDER — LEVOTHYROXINE SODIUM 75 MCG PO TABS
75.0000 ug | ORAL_TABLET | Freq: Every day | ORAL | Status: DC
  Administered 2014-05-15: 75 ug via ORAL
  Filled 2014-05-15 (×2): qty 1

## 2014-05-15 NOTE — Progress Notes (Signed)
Pt discharged home with family.  Reviewed discharge instructions and education, all questions answered.  Assessment unchanged from earlier.  

## 2014-05-15 NOTE — Progress Notes (Signed)
UR completed 

## 2014-05-15 NOTE — ED Notes (Signed)
Pt returned from xray; denies shortness of breath or dizziness

## 2014-05-15 NOTE — Discharge Summary (Signed)
Physician Discharge Summary  Natalie Maynard ZOX:[id number] DOB: 03-16-1961 DOA: 05/15/2014  PCP: Pcp Not In System  Admit date: 05/15/2014 Discharge date: 05/15/2014  Time spent: 35 minutes  Recommendations for Outpatient Follow-up:  1. Patient admitted for SOB, having a negative workup. Please follow up  Discharge Diagnoses:  Principal Problem:   DOE (dyspnea on exertion) Active Problems:   Ventricular bigeminy   Discharge Condition: Stable  Diet recommendation: Heart Healhty  Filed Weights   05/15/14 1357  Weight: 61.871 kg (136 lb 6.4 oz)    History of present illness:  Natalie Maynard is a 53 y.o. female with Past medical history of hypertension, hypothyroidism.  the patient presented with complaints of shortness of breath on exertion. She mentions that this has been ongoing since last 2 years and progressively worsening. Sometimes she has shortness of breath primarily on exertion and sometimes she has shortness of breath on lying down.  Tonight while she was walking out of her kitchen she all of a sudden started having complaints of shortness of breath. This time she had more severe attack lasting more than 30 minutes therefore she called EMS.  No prior cardiac history, no family history of cardiac disease reported.  She has reported history of hypertension but not on any medication.  She has leg swelling which is on and off. She has one week ago travel to [place] lab work.  She denies any chest pain heaviness tightness no cough no fever or chills or nausea or vomiting no abdominal pain no dizziness no lightheadedness.  She's not on aspirin.   Hospital Course:  Patient is a pleasant 53 year old female with a past medical history of hypothyroidism, gastroesophageal reflux disease, who was admitted to the medicine service on 05/15/2014. She presented with complaints of shortness of breath. Patient was initially worked up with a chest x-ray which showed borderline  cardiomegaly, no evidence of pneumonia or CHF. Labs revealed a white count within normal range at 9.6. Overnight her troponins were cycled and remained negative x3 sets. D-dimer was within normal limits. The following morning her shortness of breath had resolved. She had a transthoracic echocardiogram showing an ejection fraction of 65-70% without wall motion abnormalities. Unclear origin of shortness of breath. Recommended she follow up with her PCP in 1 week..   Procedures:  Transthoracic echocardiogram performed 05/07/2014 impression: Ejection fraction 65-70% without wall motion abnormalities   Discharge Exam: Filed Vitals:   05/15/14 1428  BP: 112/59  Pulse: 76  Temp: 98.6 F (37 C)  Resp: 14    General: No acute distress, she is breathing currently in room air, does not require supplemental oxygen Cardiovascular: Regular rate and rhythm normal S1-S2 Respiratory: Lungs are clear to auscultation bilaterally, normal inspiratory effort, good air movement no wheezing rhonchi or rales Abdomen: Soft nontender nondistended positive bowel sounds Extremities: No cyanosis clubbing  Discharge Instructions You were cared for by a hospitalist during your hospital stay. If you have any questions about your discharge medications or the care you received while you were in the hospital after you are discharged, you can call the unit and asked to speak with the hospitalist on call if the hospitalist that took care of you is not available. Once you are discharged, your primary care physician will handle any further medical issues. Please note that NO REFILLS for any discharge medications will be authorized once you are discharged, as it is imperative that you return to your primary care physician (or establish a relationship with a  primary care physician if you do not have one) for your aftercare needs so that they can reassess your need for medications and monitor your lab values.  Discharge Instructions    Call MD for:  difficulty breathing, headache or visual disturbances    Complete by:  As directed      Call MD for:  extreme fatigue    Complete by:  As directed      Call MD for:  hives    Complete by:  As directed      Call MD for:  persistant dizziness or light-headedness    Complete by:  As directed      Call MD for:  persistant nausea and vomiting    Complete by:  As directed      Call MD for:  severe uncontrolled pain    Complete by:  As directed      Call MD for:  temperature >100.4    Complete by:  As directed      Diet - low sodium heart healthy    Complete by:  As directed      Increase activity slowly    Complete by:  As directed             Medication List         levothyroxine 75 MCG tablet  Commonly known as:  SYNTHROID, LEVOTHROID  Take 75 mcg by mouth daily before breakfast.     ranitidine 150 MG tablet  Commonly known as:  ZANTAC  Take 150 mg by mouth 2 (two) times daily.       No Known Allergies     Follow-up Information   Follow up with Pcp Not In System.       The results of significant diagnostics from this hospitalization (including imaging, microbiology, ancillary and laboratory) are listed below for reference.    Significant Diagnostic Studies: Dg Chest 2 View  05/15/2014   CLINICAL DATA:  Palpitations.  EXAM: CHEST  2 VIEW  COMPARISON:  01/14/2014.  FINDINGS: Interval borderline enlargement of the cardiac silhouette. Clear lungs. Mild diffuse peribronchial thickening. Unremarkable bones.  IMPRESSION: 1. Interval borderline cardiomegaly. 2. Mild bronchitic changes.   Electronically Signed   By: Gordan Payment M.D.   On: 05/15/2014 02:05   Mm Digital Screening Bilateral  04/27/2014   CLINICAL DATA:  Screening.  EXAM: DIGITAL SCREENING BILATERAL MAMMOGRAM WITH CAD  COMPARISON:  Previous exam(s).  ACR Breast Density Category b: There are scattered areas of fibroglandular density.  FINDINGS: There are no findings suspicious for malignancy. Images  were processed with CAD.  IMPRESSION: No mammographic evidence of malignancy. A result letter of this screening mammogram will be mailed directly to the patient.  RECOMMENDATION: Screening mammogram in one year. (Code:SM-B-01Y)  BI-RADS CATEGORY  1: Negative.   Electronically Signed   By: Amie Portland M.D.   On: 04/27/2014 12:43    Microbiology: No results found for this or any previous visit (from the past 240 hour(s)).   Labs: Basic Metabolic Panel:  Recent Labs Lab 05/15/14 0145  NA 140  K 3.7  CL 103  CO2 21  GLUCOSE 106*  BUN 10  CREATININE 0.70  CALCIUM 9.0   Liver Function Tests: No results found for this basename: AST, ALT, ALKPHOS, BILITOT, PROT, ALBUMIN,  in the last 168 hours No results found for this basename: LIPASE, AMYLASE,  in the last 168 hours No results found for this basename: AMMONIA,  in the last 168 hours  CBC:  Recent Labs Lab 05/15/14 0145  WBC 9.6  NEUTROABS 4.6  HGB 13.3  HCT 38.2  MCV 85.3  PLT 319   Cardiac Enzymes:  Recent Labs Lab 05/15/14 0136 05/15/14 0710 05/15/14 0856 05/15/14 1211  TROPONINI <0.30 <0.30 <0.30 <0.30   BNP: BNP (last 3 results) No results found for this basename: PROBNP,  in the last 8760 hours CBG: No results found for this basename: GLUCAP,  in the last 168 hours     Signed:  Jeralyn Bennett  [facility] 05/15/2014, 4:33 PM

## 2014-05-15 NOTE — ED Notes (Signed)
Dr. Oni at bedside. 

## 2014-05-15 NOTE — H&P (Signed)
[facility] History and Physical  Patient: Natalie Maynard  ZOX:[id number]  DOB: 1960-10-17  DOS: the patient was seen and examined on 05/15/2014 PCP: Pcp Not In System  Chief Complaint: Shortness of breath on exertion  HPI: Natalie Maynard is a 53 y.o. female with Past medical history of hypertension, hypothyroidism.  the patient presented with complaints of shortness of breath on exertion. She mentions that this has been ongoing since last 2 years and progressively worsening. Sometimes she has shortness of breath primarily on exertion and sometimes she has shortness of breath on lying down. Tonight while she was walking out of her kitchen she all of a sudden started having complaints of shortness of breath. This time she had more severe attack lasting more than 30 minutes therefore she called EMS. No prior cardiac history, no family history of cardiac disease reported. She has reported history of hypertension but not on any medication. She has leg swelling which is on and off. She has one week ago travel to [place] lab work. She denies any chest pain heaviness tightness no cough no fever or chills or nausea or vomiting no abdominal pain no dizziness no lightheadedness. She's not on aspirin.  The patient is coming from home. And at her baseline independent for most of her ADL.  Review of Systems: as mentioned in the history of present illness.  A Comprehensive review of the other systems is negative.  Past Medical History  Diagnosis Date  . Hypertension   . Thyroid disease    History reviewed. No pertinent past surgical history. Social History:  reports that she has never smoked. She does not have any smokeless tobacco history on file. She reports that she does not drink alcohol. Her drug history is not on file.  No Known Allergies  History reviewed. No pertinent family history.  Prior to Admission medications   Medication Sig Start Date End Date Taking? Authorizing  Provider  levothyroxine (SYNTHROID, LEVOTHROID) 75 MCG tablet Take 75 mcg by mouth daily before breakfast.   Yes Historical Provider, MD  ranitidine (ZANTAC) 150 MG tablet Take 150 mg by mouth 2 (two) times daily.   Yes Historical Provider, MD    Physical Exam: Filed Vitals:   05/15/14 0300 05/15/14 0400 05/15/14 0430 05/15/14 0525  BP: 109/70 112/61 110/58 111/75  Pulse: 69 75 78 40  Temp:    98.3 F (36.8 C)  TempSrc:    Oral  Resp: Height:     (1.575 m)  SpO2: 100% 96% 96% 98%    General: Alert, Awake and Oriented to Time, Place and Person. Appear in mild distress Eyes: PERRL ENT: Oral Mucosa clear moist. Neck:  no  JVD Cardiovascular: S1 and S2 Present,  no Murmur, Peripheral Pulses Present Respiratory: Bilateral Air entry equal and Decreased, Clear to Auscultation,  no Crackles,  no  wheezes Abdomen: Bowel Sound Present, Soft and Non tender Skin:  no  Rash Extremities:  bilateral  Pedal edema,  no  calf tenderness Neurologic: Grossly no focal neuro deficit.  Labs on Admission:  CBC:  Recent Labs Lab 05/15/14 0145  WBC 9.6  NEUTROABS 4.6  HGB 13.3  HCT 38.2  MCV 85.3  PLT 319    CMP     Component Value Date/Time   NA 140 05/15/2014 0145   K 3.7 05/15/2014 0145   CL 103 05/15/2014 0145   CO2 21 05/15/2014 0145   GLUCOSE 106* 05/15/2014 0145   BUN 10  05/15/2014 0145   CREATININE 0.70 05/15/2014 0145   CALCIUM 9.0 05/15/2014 0145   PROT 9.1* 10/17/2012 1747   ALBUMIN 4.4 10/17/2012 1747   AST 26 10/17/2012 1747   ALT 23 10/17/2012 1747   ALKPHOS 89 10/17/2012 1747   BILITOT 0.4 10/17/2012 1747   GFRNONAA >90 05/15/2014 0145   GFRAA >90 05/15/2014 0145    No results found for this basename: LIPASE, AMYLASE,  in the last 168 hours No results found for this basename: AMMONIA,  in the last 168 hours   Recent Labs Lab 05/15/14 0136  TROPONINI <0.30   BNP (last 3 results) No results found for this basename: PROBNP,  in the last 8760  hours  Radiological Exams on Admission: Dg Chest 2 View  05/15/2014   CLINICAL DATA:  Palpitations.  EXAM: CHEST  2 VIEW  COMPARISON:  01/14/2014.  FINDINGS: Interval borderline enlargement of the cardiac silhouette. Clear lungs. Mild diffuse peribronchial thickening. Unremarkable bones.  IMPRESSION: 1. Interval borderline cardiomegaly. 2. Mild bronchitic changes.   Electronically Signed   By: Gordan Payment M.D.   On: 05/15/2014 02:05    EKG: Independently reviewed. normal sinus rhythm, frequent PVC's noted. Assessment/Plan Principal Problem:   DOE (dyspnea on exertion) Active Problems:   Ventricular bigeminy   1. DOE (dyspnea on exertion) The patient is presenting with complaints of exertional dyspnea progressively worsening over last few months. She is found to have ventricular bigeminy as well as multiple PVCs on telemetry. Potassium is within normal limits. At present we would admit her to the hospital. Follow serial troponin to monitor her on telemetry. Will obtain echocardiogram. Check d-dimer due to her history of recent travel. His d-dimer is positive we will need further imaging.  2.Hypothyroidism. Continue Synthroid.  3. GERD. Continue H2 blocker.  DVT Prophylaxis: subcutaneous Heparin Nutrition:  cardiac diet   Code Status:  full   Family Communication:  Daughter was present at bedside, opportunity was given to ask question and all questions were answered satisfactorily at the time of interview. Disposition: Admitted to observation in telemetry unit.  Author: Lynden Oxford, MD [facility] Pager: [phone] 05/15/2014, 6:44 AM    If 7PM-7AM, please contact night-coverage [email or web address] Password [email or web address]  **Disclaimer: This note may have been dictated with voice recognition software. Similar sounding words can inadvertently be transcribed and this note may contain transcription errors which may not have been corrected upon publication of note.**

## 2014-05-15 NOTE — ED Notes (Addendum)
Pt to ED from home c/o shortness of breath and dizziness. EMS EKG showed bigeminy; pt has no cardiac history. Denies NV. Reports dizziness has subsided. Denies chest pain or shortness of breath at this time

## 2014-05-15 NOTE — Progress Notes (Signed)
  Echocardiogram 2D Echocardiogram has been performed.  Natalie Maynard 05/15/2014, 12:17 PM

## 2014-05-15 NOTE — ED Provider Notes (Signed)
CSN: [id number]     Arrival date & time 05/15/14  0107 History   First MD Initiated Contact with Patient 05/15/14 0134     Chief Complaint  Patient presents with  . Palpitations     (Consider location/radiation/quality/duration/timing/severity/associated sxs/prior Treatment) HPI  Natalie Maynard is a 53 year old female with a past medical history of hypothyroidism presenting with sudden onset shortness of breath occurring at 11:30 PM tonight. Patient states she was cooking and walking around her house when she suddenly became short of breath. She denies any chest pain. She did become diaphoretic but denies nausea or vomiting. Patient states it has not happened to her before. She does know she has a history of an enlarged heart but denies any cardiac history. She denies any fevers recent infections abdominal pain or changes in her stool or urine. She denies any swelling or pain in her legs.  Past Medical History  Diagnosis Date  . Hypertension   . Thyroid disease    History reviewed. No pertinent past surgical history. History reviewed. No pertinent family history. History  Substance Use Topics  . Smoking status: Never Smoker   . Smokeless tobacco: Not on file  . Alcohol Use: No   OB History   Grav Para Term Preterm Abortions TAB SAB Ect Mult Living                 Review of Systems 10 Systems reviewed and are negative for acute change except as noted in the HPI.    Allergies  Review of patient's allergies indicates no known allergies.  Home Medications   Prior to Admission medications   Medication Sig Start Date End Date Taking? Authorizing Provider  levothyroxine (SYNTHROID, LEVOTHROID) 75 MCG tablet Take 75 mcg by mouth daily before breakfast.   Yes Historical Provider, MD  ranitidine (ZANTAC) 150 MG tablet Take 150 mg by mouth 2 (two) times daily.   Yes Historical Provider, MD   BP 110/58  Pulse 78  Temp(Src) 97.5 F (36.4 C)  Resp 17  SpO2 96%  LMP  05/04/2014 Physical Exam  Nursing note and vitals reviewed. Constitutional: She is oriented to person, place, and time. She appears well-developed and well-nourished. No distress.  HENT:  Head: Normocephalic and atraumatic.  Nose: Nose normal.  Mouth/Throat: Oropharynx is clear and moist. No oropharyngeal exudate.  Eyes: Conjunctivae and EOM are normal. Pupils are equal, round, and reactive to light. No scleral icterus.  Neck: Normal range of motion. Neck supple. No JVD present. No tracheal deviation present. No thyromegaly present.  Cardiovascular: Normal rate and regular rhythm.  Exam reveals no gallop and no friction rub.   No murmur heard. Additional heart beat is appreciated with every regular heartbeat.  Pulmonary/Chest: Effort normal and breath sounds normal. No respiratory distress. She has no wheezes. She exhibits no tenderness.  Abdominal: Soft. Bowel sounds are normal. She exhibits no distension and no mass. There is no tenderness. There is no rebound and no guarding.  Musculoskeletal: Normal range of motion. She exhibits no edema and no tenderness.  Lymphadenopathy:    She has no cervical adenopathy.  Neurological: She is alert and oriented to person, place, and time.  Skin: Skin is warm and dry. No rash noted. She is not diaphoretic. No erythema. No pallor.    ED Course  Procedures (including critical care time) Labs Review Labs Reviewed  CBC WITH DIFFERENTIAL - Abnormal; Notable for the following:    Lymphs Abs 4.3 (*)    All other  components within normal limits  BASIC METABOLIC PANEL - Abnormal; Notable for the following:    Glucose, Bld 106 (*)    Anion gap 16 (*)    All other components within normal limits  TROPONIN I  HCG, SERUM, QUALITATIVE  TSH  T4, FREE    Imaging Review Dg Chest 2 View  05/15/2014   CLINICAL DATA:  Palpitations.  EXAM: CHEST  2 VIEW  COMPARISON:  01/14/2014.  FINDINGS: Interval borderline enlargement of the cardiac silhouette. Clear  lungs. Mild diffuse peribronchial thickening. Unremarkable bones.  IMPRESSION: 1. Interval borderline cardiomegaly. 2. Mild bronchitic changes.   Electronically Signed   By: Gordan Payment M.D.   On: 05/15/2014 02:05     EKG Interpretation   Date/Time:  Friday May 15 2014 01:19:54 EDT Ventricular Rate:  91 PR Interval:  155 QRS Duration: 81 QT Interval:  388 QTC Calculation: 477 R Axis:   76 Text Interpretation:  Sinus rhythm Ventricular bigeminy Confirmed by Erroll Luna [id number]) on 05/15/2014 5:15:18 AM      MDM   Final diagnoses:  Ventricular bigeminy    Patient comes in emergency department out of concern for her shortness of breath. Monitor is reading ventricular bigeminy. I'm concerned that this may be a sequelae of her thyroid versus cardiac etiology. Patient will be retained in the hospital for further diagnostics. I have low suspicion for PE in this patient. She's not sure any chest pain, she is no tachycardia or hypoxia. She states all her symptoms have now resolved.    Tomasita Crumble, MD 05/15/14 [phone]

## 2014-07-03 ENCOUNTER — Other Ambulatory Visit: Payer: Self-pay

## 2015-06-16 ENCOUNTER — Other Ambulatory Visit: Payer: Self-pay

## 2015-06-16 DIAGNOSIS — Z1231 Encounter for screening mammogram for malignant neoplasm of breast: Secondary | ICD-10-CM

## 2015-06-21 ENCOUNTER — Ambulatory Visit
Admission: RE | Admit: 2015-06-21 | Discharge: 2015-06-21 | Disposition: A | Discharge status: Home / Self Care | Payer: 59 | Source: Ambulatory Visit

## 2015-06-21 DIAGNOSIS — Z1231 Encounter for screening mammogram for malignant neoplasm of breast: Secondary | ICD-10-CM

## 2015-12-01 ENCOUNTER — Other Ambulatory Visit: Payer: Self-pay | Admitting: Internal Medicine

## 2015-12-01 ENCOUNTER — Ambulatory Visit
Admission: RE | Admit: 2015-12-01 | Discharge: 2015-12-01 | Disposition: A | Discharge status: Home / Self Care | Payer: BLUE CROSS/BLUE SHIELD | Source: Ambulatory Visit | Attending: Internal Medicine | Admitting: Internal Medicine

## 2015-12-01 DIAGNOSIS — Z0000 Encounter for general adult medical examination without abnormal findings: Secondary | ICD-10-CM

## 2016-04-19 ENCOUNTER — Other Ambulatory Visit: Payer: Self-pay | Admitting: Internal Medicine

## 2016-04-19 DIAGNOSIS — R109 Unspecified abdominal pain: Secondary | ICD-10-CM

## 2016-04-26 ENCOUNTER — Ambulatory Visit
Admission: RE | Admit: 2016-04-26 | Discharge: 2016-04-26 | Disposition: A | Discharge status: Home / Self Care | Payer: BLUE CROSS/BLUE SHIELD | Source: Ambulatory Visit | Attending: Internal Medicine | Admitting: Internal Medicine

## 2016-04-26 DIAGNOSIS — R109 Unspecified abdominal pain: Secondary | ICD-10-CM

## 2016-08-01 ENCOUNTER — Other Ambulatory Visit: Payer: Self-pay | Admitting: Internal Medicine

## 2016-08-01 DIAGNOSIS — Z1231 Encounter for screening mammogram for malignant neoplasm of breast: Secondary | ICD-10-CM

## 2016-08-18 ENCOUNTER — Ambulatory Visit
Admission: RE | Admit: 2016-08-18 | Discharge: 2016-08-18 | Disposition: A | Discharge status: Home / Self Care | Payer: BLUE CROSS/BLUE SHIELD | Source: Ambulatory Visit | Attending: Internal Medicine | Admitting: Internal Medicine

## 2016-08-18 DIAGNOSIS — Z1231 Encounter for screening mammogram for malignant neoplasm of breast: Secondary | ICD-10-CM

## 2016-11-29 ENCOUNTER — Ambulatory Visit
Admission: RE | Admit: 2016-11-29 | Discharge: 2016-11-29 | Disposition: A | Discharge status: Home / Self Care | Payer: BLUE CROSS/BLUE SHIELD | Source: Ambulatory Visit | Attending: Internal Medicine | Admitting: Internal Medicine

## 2016-11-29 ENCOUNTER — Other Ambulatory Visit: Payer: Self-pay | Admitting: Internal Medicine

## 2016-11-29 DIAGNOSIS — M79632 Pain in left forearm: Secondary | ICD-10-CM

## 2017-05-28 ENCOUNTER — Other Ambulatory Visit: Payer: Self-pay | Admitting: Nurse Practitioner

## 2017-05-28 ENCOUNTER — Ambulatory Visit
Admission: RE | Admit: 2017-05-28 | Discharge: 2017-05-28 | Disposition: A | Discharge status: Home / Self Care | Payer: BLUE CROSS/BLUE SHIELD | Source: Ambulatory Visit | Attending: Nurse Practitioner | Admitting: Nurse Practitioner

## 2017-05-28 DIAGNOSIS — M25572 Pain in left ankle and joints of left foot: Secondary | ICD-10-CM

## 2017-08-09 ENCOUNTER — Other Ambulatory Visit: Payer: Self-pay

## 2017-08-09 ENCOUNTER — Encounter (HOSPITAL_BASED_OUTPATIENT_CLINIC_OR_DEPARTMENT_OTHER): Payer: Self-pay

## 2017-08-09 ENCOUNTER — Emergency Department (HOSPITAL_BASED_OUTPATIENT_CLINIC_OR_DEPARTMENT_OTHER)
Admission: EM | Admit: 2017-08-09 | Discharge: 2017-08-09 | Disposition: A | Discharge status: Home / Self Care | Payer: BLUE CROSS/BLUE SHIELD | Attending: Physician Assistant | Admitting: Physician Assistant

## 2017-08-09 DIAGNOSIS — J069 Acute upper respiratory infection, unspecified: Secondary | ICD-10-CM

## 2017-08-09 DIAGNOSIS — R0981 Nasal congestion: Secondary | ICD-10-CM | POA: Diagnosis present

## 2017-08-09 DIAGNOSIS — Z79899 Other long term (current) drug therapy: Secondary | ICD-10-CM | POA: Insufficient documentation

## 2017-08-09 DIAGNOSIS — I10 Essential (primary) hypertension: Secondary | ICD-10-CM | POA: Diagnosis not present

## 2017-08-09 LAB — RAPID STREP SCREEN (MED CTR MEBANE ONLY): Streptococcus, Group A Screen (Direct): NEGATIVE

## 2017-08-09 NOTE — Discharge Instructions (Signed)
Your strep test was negative.  This is likely a viral upper respiratory tract infection.  I would continue using over-the-counter medications including nasal decongestant such as Sudafed.  Please avoid any additional medications contain this.  I would also make sure you are performing nasal rinses.  Continue to using the Flonase that you have over-the-counter.  You may also use Claritin over-the-counter.  I would also use the nasal mister to help keep the nostrils moist.  You can use the Afrin however do not use this as more than directed.  Make sure you follow-up with a primary care doctor.  This can last for 5-7 days.  If your symptoms last for longer than 10 days you may do antibiotics.  Return to the ED with any worsening symptoms.

## 2017-08-09 NOTE — ED Triage Notes (Signed)
C/o nasal congestion, runny nose, sore throat x 2 days-NAD-steady gait-adult daughter interpreting for pt

## 2017-08-12 LAB — CULTURE, GROUP A STREP (THRC)

## 2017-08-12 NOTE — ED Provider Notes (Signed)
[facility] EMERGENCY DEPARTMENT Provider Note   CSN: [id number] Arrival date & time: 08/09/17  1640     History   Chief Complaint Chief Complaint  Patient presents with  . Nasal Congestion    HPI Natalie Maynard is a 56 y.o. female.  HPI 56 year old female with past medical history significant for hypertension and thyroid disease presents to the ED with complaints of nasal congestion, rhinorrhea, sore throat for the past 2 days.  Patient states her congestion in her nose is been so severe that she cannot sleep due to not being able to breathe through her nose.  Patient reports sinus pressure and congestion as well.  Denies any associated fever, chills, otalgia, chest pain, shortness of breath.  Patient has tried some over-the-counter medications with no relief.  Patient is wanting something to help with her nasal congestion.  Denies any known sick contacts.  Denies any associated myalgias. Past Medical History:  Diagnosis Date  . Hypertension   . Thyroid disease     Patient Active Problem List   Diagnosis Date Noted  . 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 DOE (dyspnea on exertion) 05/15/2014  . HEADACHE 06/17/2007    History reviewed. No pertinent surgical history.  OB History    No data available       Home Medications    Prior to Admission medications   Medication Sig Start Date End Date Taking? Authorizing Provider  levothyroxine (SYNTHROID, LEVOTHROID) 75 MCG tablet Take 75 mcg by mouth daily before breakfast.    [provider]  ranitidine (ZANTAC) 150 MG tablet Take 150 mg by mouth 2 (two) times daily.    [provider]    Family History No family history on file.  Social History Social History   Tobacco Use  . Smoking status: Never Smoker  . Smokeless tobacco: Never Used  Substance Use Topics  . Alcohol use: No  . Drug use: No     Allergies   Patient has no known allergies.   Review of Systems Review of Systems    Constitutional: Negative for chills and fever.  HENT: Positive for congestion, postnasal drip, rhinorrhea, sinus pressure, sinus pain and sore throat. Negative for ear pain.   Respiratory: Negative for cough and shortness of breath.   Cardiovascular: Negative for chest pain.  Gastrointestinal: Negative for nausea and vomiting.  Musculoskeletal: Negative for myalgias.     Physical Exam Updated Vital Signs BP (!) 146/92 (BP Location: Left Arm)   Pulse 75   Temp 98.5 F (36.9 C) (Oral)   Resp 18   Ht 5\' 2"  (1.575 m)   Wt 72.1 kg (159 lb)   LMP 05/04/2014   SpO2 100%   BMI 29.08 kg/m   Physical Exam  Constitutional: She appears well-developed and well-nourished. No distress.  HENT:  Head: Normocephalic and atraumatic.  Right Ear: Tympanic membrane, external ear and ear canal normal.  Left Ear: Tympanic membrane, external ear and ear canal normal.  Nose: Mucosal edema and rhinorrhea present. Right sinus exhibits maxillary sinus tenderness and frontal sinus tenderness. Left sinus exhibits maxillary sinus tenderness and frontal sinus tenderness.  Mouth/Throat: Uvula is midline, oropharynx is clear and moist and mucous membranes are normal. No trismus in the jaw. No uvula swelling. No tonsillar exudate.  Eyes: Conjunctivae are normal. Pupils are equal, round, and reactive to light. Right eye exhibits no discharge. Left eye exhibits no discharge. No scleral icterus.  Neck: Normal range of motion. Neck supple.  No c  spine midline tenderness. No paraspinal tenderness. No deformities or step offs noted. Full ROM. Supple. No nuchal rigidity.    Pulmonary/Chest: Effort normal and breath sounds normal. No stridor. No respiratory distress. She has no wheezes. She has no rales. She exhibits no tenderness.  Musculoskeletal: Normal range of motion.  Neurological: She is alert.  Skin: No pallor.  Psychiatric: Her behavior is normal. Judgment and thought content normal.  Nursing note and vitals  reviewed.    ED Treatments / Results  Labs (all labs ordered are listed, but only abnormal results are displayed) Labs Reviewed  RAPID STREP SCREEN (NOT [facility])  CULTURE, GROUP A STREP [facility])    EKG  EKG Interpretation None       Radiology No results found.  Procedures Procedures (including critical care time)  Medications Ordered in ED Medications - No data to display   Initial Impression / Assessment and Plan / ED Course  I have reviewed the triage vital signs and the nursing notes.  Pertinent labs & imaging results that were available during my care of the patient were reviewed by me and considered in my medical decision making (see chart for details).     Patient complaining of symptoms of sinusitis.  Mild to moderate symptoms of clear/yellow nasal discharge/congestion and scratchy throat with cough for less than 10 days.  Patient is afebrile.  No concern for acute bacterial rhinosinusitis; likely viral in nature.  Patient discharged with symptomatic treatment.  Patient instructions given for warm saline nasal washes.  Recommendations for follow-up with primary care physician.     Final Clinical Impressions(s) / ED Diagnoses   Final diagnoses:  Upper respiratory tract infection, unspecified type    ED Discharge Orders    None       Rise MuLeaphart, Jannifer Fischler T, PA-C 08/12/17 1458    Abelino DerrickMackuen, Courteney Lyn, MD 08/14/17 [id number]

## 2017-08-20 ENCOUNTER — Other Ambulatory Visit: Payer: Self-pay | Admitting: Internal Medicine

## 2017-08-20 ENCOUNTER — Ambulatory Visit
Admission: RE | Admit: 2017-08-20 | Discharge: 2017-08-20 | Disposition: A | Discharge status: Home / Self Care | Payer: BLUE CROSS/BLUE SHIELD | Source: Ambulatory Visit | Attending: Internal Medicine | Admitting: Internal Medicine

## 2017-08-20 DIAGNOSIS — Z1231 Encounter for screening mammogram for malignant neoplasm of breast: Secondary | ICD-10-CM

## 2018-08-19 ENCOUNTER — Other Ambulatory Visit: Payer: Self-pay | Admitting: Internal Medicine

## 2018-08-19 DIAGNOSIS — Z1231 Encounter for screening mammogram for malignant neoplasm of breast: Secondary | ICD-10-CM

## 2018-10-01 ENCOUNTER — Ambulatory Visit
Admission: RE | Admit: 2018-10-01 | Discharge: 2018-10-01 | Disposition: A | Discharge status: Home / Self Care | Payer: BLUE CROSS/BLUE SHIELD | Source: Ambulatory Visit | Attending: Internal Medicine | Admitting: Internal Medicine

## 2018-10-01 DIAGNOSIS — Z1231 Encounter for screening mammogram for malignant neoplasm of breast: Secondary | ICD-10-CM

## 2020-08-25 IMAGING — MG DIGITAL SCREENING BILATERAL MAMMOGRAM WITH CAD
4 series · 4 of 4 positions shown · non-contrast
Comparison: Previous exam(s).

CLINICAL DATA: Screening.

EXAM:
DIGITAL SCREENING BILATERAL MAMMOGRAM WITH CAD

[L CC]
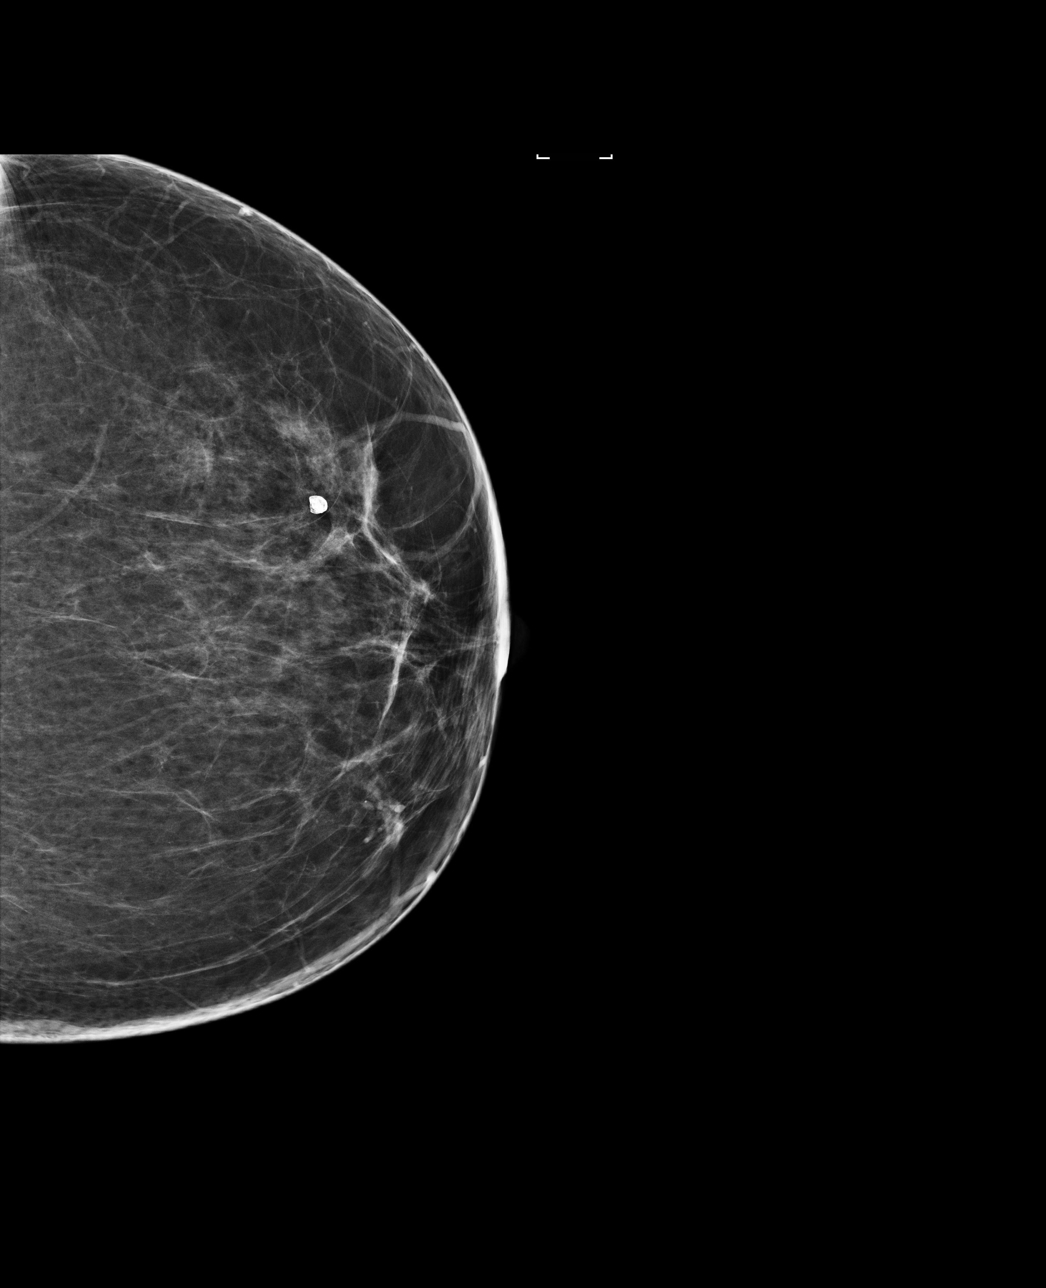

[R MLO]
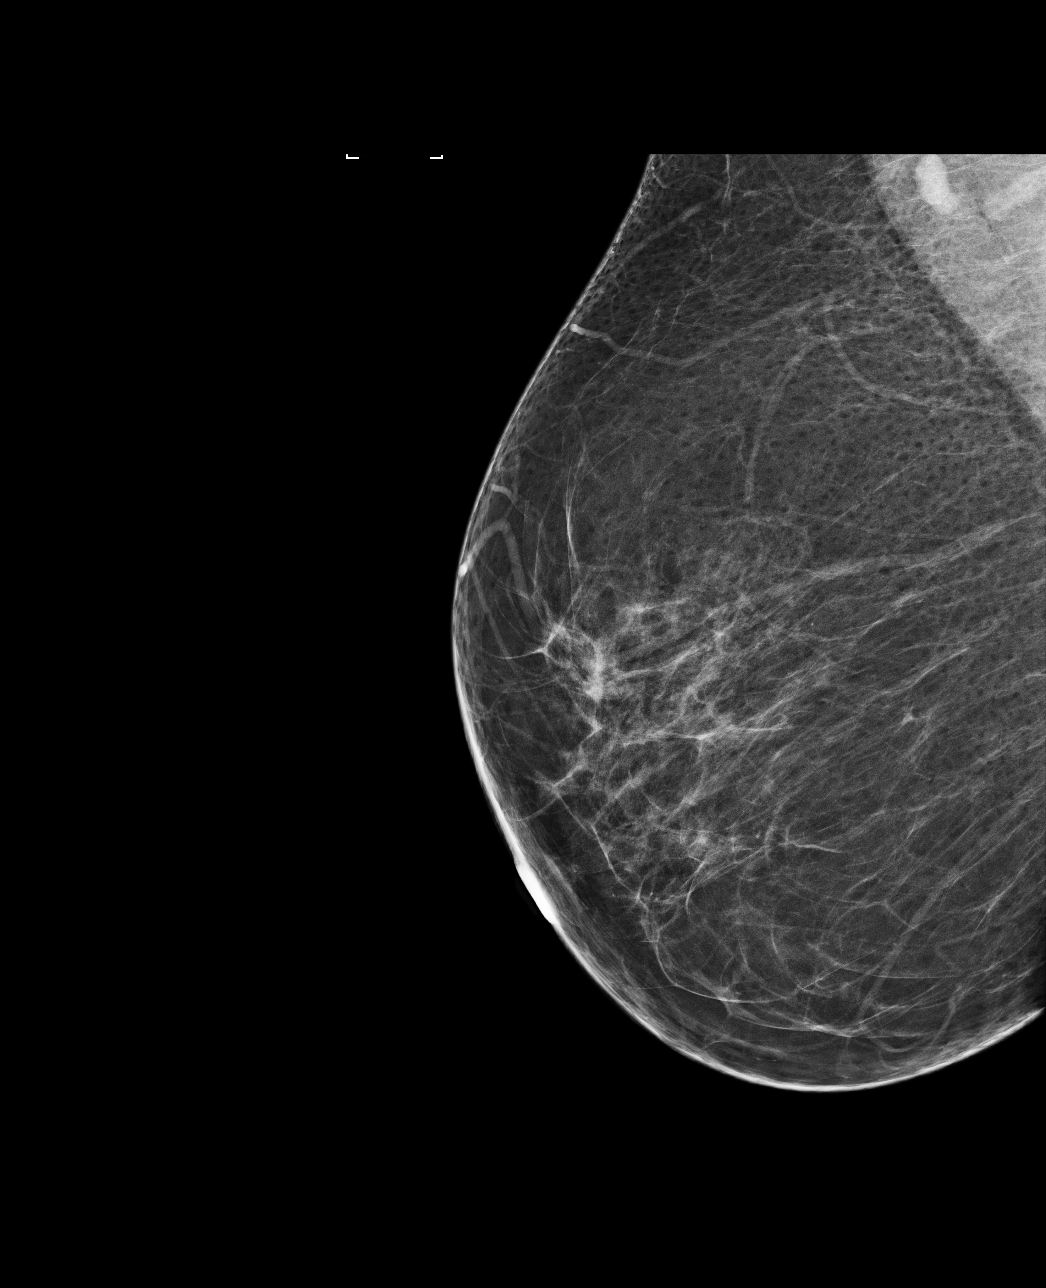

[R CC]
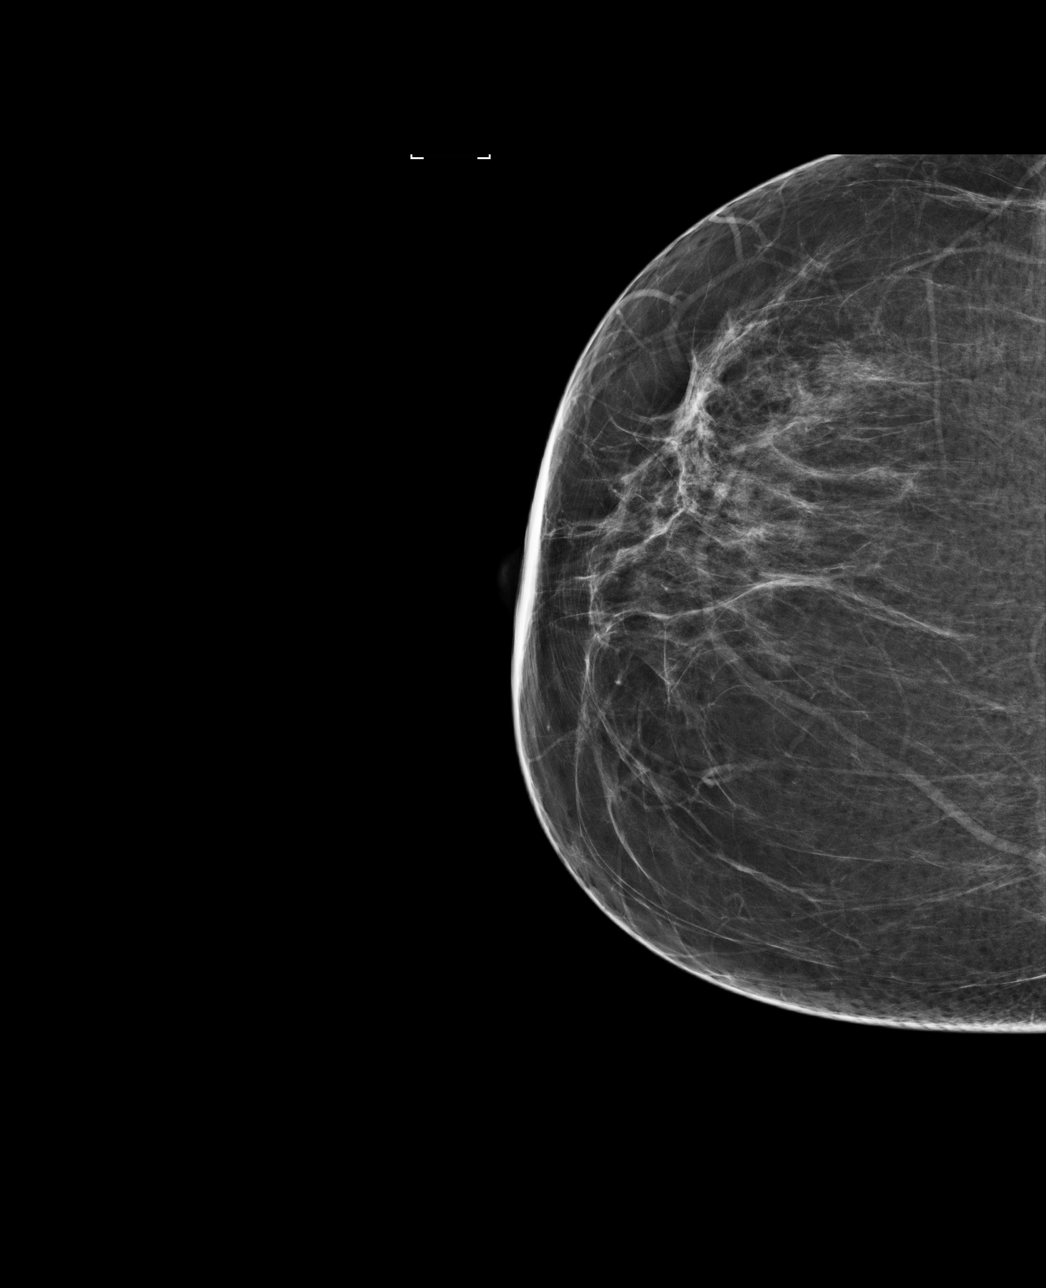

[L MLO]
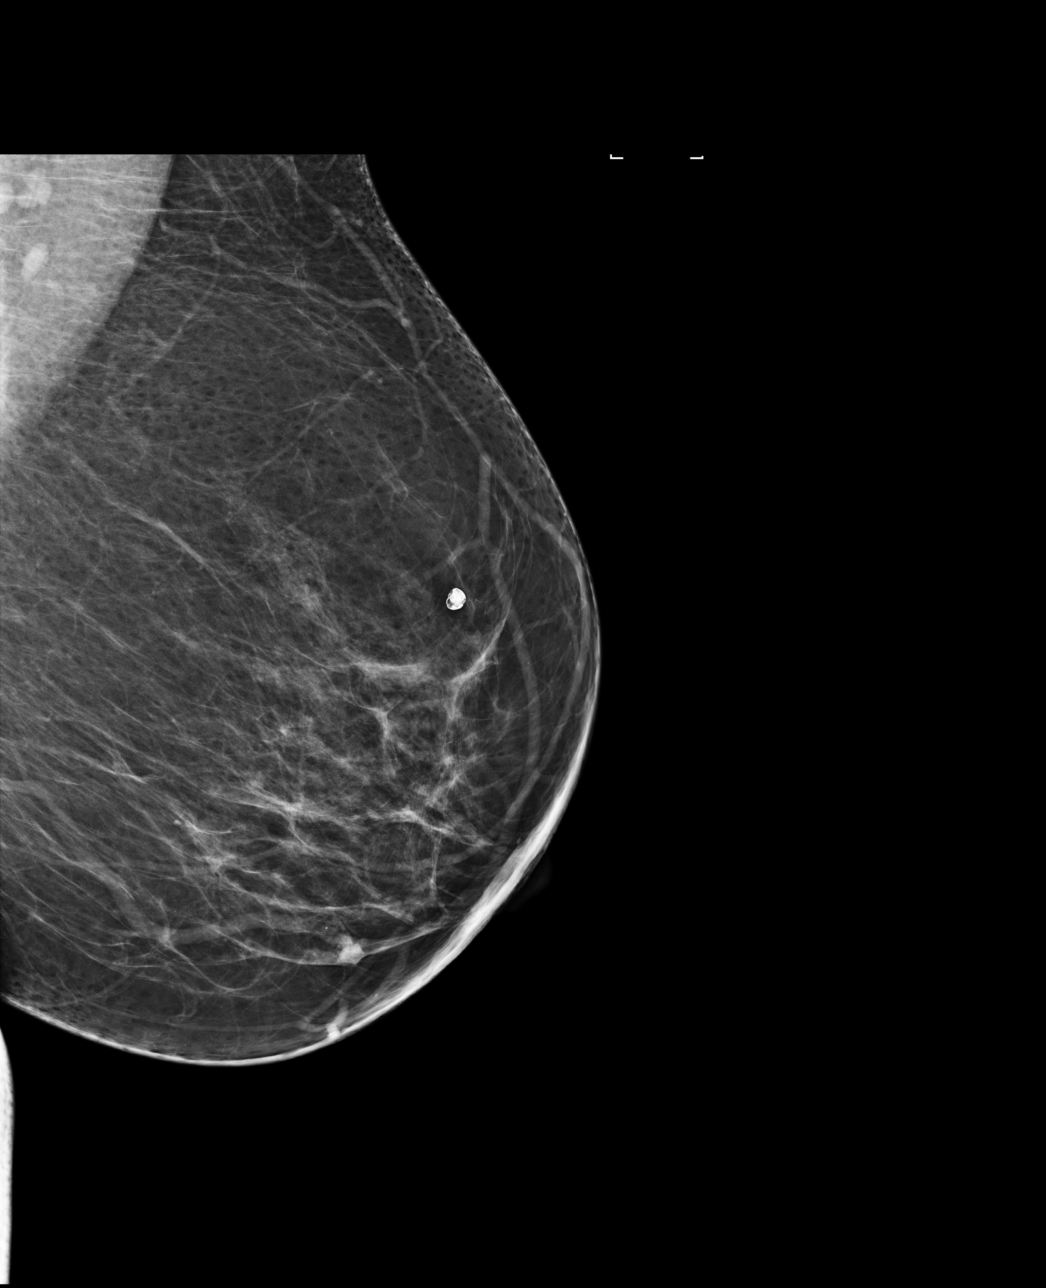

[4 of 4 positions shown; findings below may reference images not displayed]

ACR Breast Density Category b: There are scattered areas of
fibroglandular density.
FINDINGS: There are no findings suspicious for malignancy. Images were
processed with CAD.
IMPRESSION: No mammographic evidence of malignancy. A result letter of this
screening mammogram will be mailed directly to the patient.

RECOMMENDATION:
Screening mammogram in one year. (Code:AS-G-LCT)

BI-RADS CATEGORY  1: Negative.

## 2021-12-07 ENCOUNTER — Other Ambulatory Visit: Payer: Self-pay | Admitting: Internal Medicine

## 2021-12-07 DIAGNOSIS — Z1231 Encounter for screening mammogram for malignant neoplasm of breast: Secondary | ICD-10-CM

## 2022-01-19 ENCOUNTER — Ambulatory Visit (INDEPENDENT_AMBULATORY_CARE_PROVIDER_SITE_OTHER): Payer: 59

## 2022-01-19 DIAGNOSIS — Z1231 Encounter for screening mammogram for malignant neoplasm of breast: Secondary | ICD-10-CM | POA: Diagnosis not present

## 2022-02-24 ENCOUNTER — Other Ambulatory Visit (HOSPITAL_COMMUNITY)
Admission: RE | Admit: 2022-02-24 | Discharge: 2022-02-24 | Disposition: A | Discharge status: Home / Self Care | Payer: 59 | Source: Ambulatory Visit | Attending: Nurse Practitioner | Admitting: Nurse Practitioner

## 2022-02-24 DIAGNOSIS — Z124 Encounter for screening for malignant neoplasm of cervix: Secondary | ICD-10-CM | POA: Insufficient documentation

## 2022-02-27 LAB — CYTOLOGY - PAP
Comment: NEGATIVE
Diagnosis: NEGATIVE
High risk HPV: NEGATIVE

## 2022-03-20 ENCOUNTER — Other Ambulatory Visit: Payer: Self-pay | Admitting: Internal Medicine

## 2022-03-20 ENCOUNTER — Ambulatory Visit
Admission: RE | Admit: 2022-03-20 | Discharge: 2022-03-20 | Disposition: A | Discharge status: Home / Self Care | Payer: No Typology Code available for payment source | Source: Ambulatory Visit | Attending: Internal Medicine | Admitting: Internal Medicine

## 2022-03-20 DIAGNOSIS — M549 Dorsalgia, unspecified: Secondary | ICD-10-CM
